# Patient Record
Sex: Male | Born: 1960 | Race: White | Hispanic: No | Smoking: Current every day smoker
Health system: Southern US, Community
[De-identification: ages and names within clinical notes are randomized; demographics above are authoritative.]

## PROBLEM LIST (undated history)

## (undated) DIAGNOSIS — E785 Hyperlipidemia, unspecified: Secondary | ICD-10-CM

## (undated) DIAGNOSIS — S060X9A Concussion with loss of consciousness of unspecified duration, initial encounter: Secondary | ICD-10-CM

## (undated) DIAGNOSIS — J439 Emphysema, unspecified: Secondary | ICD-10-CM

## (undated) DIAGNOSIS — E119 Type 2 diabetes mellitus without complications: Secondary | ICD-10-CM

## (undated) HISTORY — DX: Hyperlipidemia, unspecified: E78.5

## (undated) HISTORY — DX: Emphysema, unspecified: J43.9

## (undated) HISTORY — DX: Type 2 diabetes mellitus without complications: E11.9

## (undated) HISTORY — DX: Concussion with loss of consciousness of unspecified duration, initial encounter: S06.0X9A

---
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
## 2015-04-01 HISTORY — PX: COLONOSCOPY: SHX174

## 2015-05-15 ENCOUNTER — Encounter: Payer: Self-pay | Admitting: Internal Medicine

## 2015-05-15 ENCOUNTER — Ambulatory Visit (INDEPENDENT_AMBULATORY_CARE_PROVIDER_SITE_OTHER): Payer: PRIVATE HEALTH INSURANCE | Admitting: Internal Medicine

## 2015-05-15 VITALS — BP 154/78 | HR 108 | Temp 98.2°F | Resp 15 | Ht 70.0 in | Wt 245.2 lb

## 2015-05-15 DIAGNOSIS — E559 Vitamin D deficiency, unspecified: Secondary | ICD-10-CM | POA: Diagnosis not present

## 2015-05-15 DIAGNOSIS — Z23 Encounter for immunization: Secondary | ICD-10-CM | POA: Diagnosis not present

## 2015-05-15 DIAGNOSIS — R0683 Snoring: Secondary | ICD-10-CM

## 2015-05-15 DIAGNOSIS — E785 Hyperlipidemia, unspecified: Secondary | ICD-10-CM | POA: Diagnosis not present

## 2015-05-15 DIAGNOSIS — R3911 Hesitancy of micturition: Secondary | ICD-10-CM

## 2015-05-15 DIAGNOSIS — Z1211 Encounter for screening for malignant neoplasm of colon: Secondary | ICD-10-CM

## 2015-05-15 DIAGNOSIS — R059 Cough, unspecified: Secondary | ICD-10-CM

## 2015-05-15 DIAGNOSIS — Z720 Tobacco use: Secondary | ICD-10-CM | POA: Insufficient documentation

## 2015-05-15 DIAGNOSIS — M549 Dorsalgia, unspecified: Secondary | ICD-10-CM | POA: Insufficient documentation

## 2015-05-15 DIAGNOSIS — R599 Enlarged lymph nodes, unspecified: Secondary | ICD-10-CM

## 2015-05-15 DIAGNOSIS — R5383 Other fatigue: Secondary | ICD-10-CM | POA: Diagnosis not present

## 2015-05-15 DIAGNOSIS — E669 Obesity, unspecified: Secondary | ICD-10-CM | POA: Insufficient documentation

## 2015-05-15 DIAGNOSIS — M5442 Lumbago with sciatica, left side: Secondary | ICD-10-CM

## 2015-05-15 DIAGNOSIS — Z125 Encounter for screening for malignant neoplasm of prostate: Secondary | ICD-10-CM | POA: Insufficient documentation

## 2015-05-15 DIAGNOSIS — R590 Localized enlarged lymph nodes: Secondary | ICD-10-CM

## 2015-05-15 DIAGNOSIS — B029 Zoster without complications: Secondary | ICD-10-CM

## 2015-05-15 DIAGNOSIS — Z1159 Encounter for screening for other viral diseases: Secondary | ICD-10-CM

## 2015-05-15 DIAGNOSIS — R030 Elevated blood-pressure reading, without diagnosis of hypertension: Secondary | ICD-10-CM

## 2015-05-15 DIAGNOSIS — M545 Low back pain, unspecified: Secondary | ICD-10-CM

## 2015-05-15 DIAGNOSIS — B019 Varicella without complication: Secondary | ICD-10-CM

## 2015-05-15 DIAGNOSIS — D126 Benign neoplasm of colon, unspecified: Secondary | ICD-10-CM | POA: Insufficient documentation

## 2015-05-15 DIAGNOSIS — R339 Retention of urine, unspecified: Secondary | ICD-10-CM

## 2015-05-15 DIAGNOSIS — R05 Cough: Secondary | ICD-10-CM

## 2015-05-15 DIAGNOSIS — E784 Other hyperlipidemia: Secondary | ICD-10-CM

## 2015-05-15 LAB — COMPREHENSIVE METABOLIC PANEL
ALK PHOS: 103 U/L (ref 39–117)
ALT: 36 U/L (ref 0–53)
AST: 30 U/L (ref 0–37)
Albumin: 4.6 g/dL (ref 3.5–5.2)
BUN: 9 mg/dL (ref 6–23)
CHLORIDE: 98 meq/L (ref 96–112)
CO2: 29 meq/L (ref 19–32)
Calcium: 9.7 mg/dL (ref 8.4–10.5)
Creatinine, Ser: 0.69 mg/dL (ref 0.40–1.50)
GFR: 127.54 mL/min (ref >60.00)
GLUCOSE: 214 mg/dL — AB (ref 70–99)
POTASSIUM: 3.8 meq/L (ref 3.5–5.1)
SODIUM: 135 meq/L (ref 135–145)
TOTAL PROTEIN: 8.1 g/dL (ref 6.0–8.3)
Total Bilirubin: 0.8 mg/dL (ref 0.2–1.2)

## 2015-05-15 LAB — VITAMIN D 25 HYDROXY (VIT D DEFICIENCY, FRACTURES): VITD: 9.06 ng/mL — ABNORMAL LOW (ref 30.00–100.00)

## 2015-05-15 LAB — CBC WITH DIFFERENTIAL/PLATELET
BASOS PCT: 0.4 % (ref 0.0–3.0)
Basophils Absolute: 0.1 10*3/uL (ref 0.0–0.1)
EOS ABS: 0.1 10*3/uL (ref 0.0–0.7)
EOS PCT: 0.6 % (ref 0.0–5.0)
HEMATOCRIT: 46.7 % (ref 39.0–52.0)
HEMOGLOBIN: 16 g/dL (ref 13.0–17.0)
LYMPHS PCT: 24.1 % (ref 12.0–46.0)
Lymphs Abs: 3.1 10*3/uL (ref 0.7–4.0)
MCHC: 34.2 g/dL (ref 30.0–36.0)
MCV: 84 fl (ref 78.0–100.0)
MONOS PCT: 5.2 % (ref 3.0–12.0)
Monocytes Absolute: 0.7 10*3/uL (ref 0.1–1.0)
NEUTROS ABS: 8.9 10*3/uL — AB (ref 1.4–7.7)
Neutrophils Relative %: 69.7 % (ref 43.0–77.0)
PLATELETS: 178 10*3/uL (ref 150.0–400.0)
RBC: 5.56 Mil/uL (ref 4.22–5.81)
RDW: 12.9 % (ref 11.5–15.5)
WBC: 12.8 10*3/uL — ABNORMAL HIGH (ref 4.0–10.5)

## 2015-05-15 LAB — LIPID PANEL
CHOL/HDL RATIO: 6
Cholesterol: 171 mg/dL (ref 0–200)
HDL: 26.6 mg/dL — AB (ref >39.00)
LDL CALC: 113 mg/dL — AB (ref 0–99)
NONHDL: 144.56
TRIGLYCERIDES: 157 mg/dL — AB (ref 0.0–149.0)
VLDL: 31.4 mg/dL (ref 0.0–40.0)

## 2015-05-15 LAB — HEMOGLOBIN A1C: Hgb A1c MFr Bld: 10.5 % — ABNORMAL HIGH (ref 4.6–6.5)

## 2015-05-15 LAB — PSA: PSA: 0.53 ng/mL (ref 0.10–4.00)

## 2015-05-15 LAB — TSH: TSH: 1.22 u[IU]/mL (ref 0.35–4.50)

## 2015-05-15 NOTE — Assessment & Plan Note (Signed)
Checking varicella titers since he cannot recall childhood infection with chicken pox

## 2015-05-15 NOTE — Progress Notes (Signed)
Pre visit review using our clinic review tool, if applicable. No additional management support is needed unless otherwise documented below in the visit note. 

## 2015-05-15 NOTE — Patient Instructions (Signed)
I think your rash was a case of shingles,,  It appears to be resolivng  Your blood tests today are going to screen you  For  Prostate and urinary issues Prior immunity to chickenpox Cholesterol Diabetes Thyroid Vitamin deficiencies (b12 and D)   Your blood pressure is up today (normal is 130/80 or less)  Please get it checked a few times AWAY from the doctor's office over the next month to see if is still up  Chest x ray and low back films have been ordered.   You can go any time during 8 to 5 to [facility],  Radiology dept) or to the [facility] office   We will get the colon cancer screening test approved by your insurance and it will be sent to your home

## 2015-05-15 NOTE — Assessment & Plan Note (Signed)
Likely bph.  ruling out infection , checking psa

## 2015-05-15 NOTE — Progress Notes (Signed)
Subjective:  Patient ID: Frank Hicks, male    DOB: Aug 21, 1960  Age: 55 y.o. MRN: [id number]  CC: The primary encounter diagnosis was Vitamin D deficiency. Diagnoses of Obesity, Other fatigue, Dyslipidemia, Urinary hesitancy, Screening for prostate cancer, Back pain at L4-L5 level, Cough, Varicella-zoster infection, Need for hepatitis C screening test, Snoring, Elevated blood pressure reading without diagnosis of hypertension, Dyslipidemia (high LDL; low HDL), Shingles rash, Left cervical lymphadenopathy, Tobacco abuse, Left-sided low back pain with left-sided sciatica, Urinary retention, and Screening for colon cancer were also pertinent to this visit.  HPI Frank Hicks presents for new patient evaluation with several recent new onset symptoms  1) New onset back pain.  Low back pain in the low back on the left that started radiating into the left buttock  And posterior   Thigh. He denies numbness or weakness.  The pain is intermittent and is aggravated by sitting.   making it hard to sleep .  Typically a left side sleeper.   No prior injury to lower back and no recent unusual activity of heavy lifting.   He took several doses of ibuprofen (which he has taken before, but not in a while) and developed a vesicular rash on front of his neck accompanied by cervical lymphadenopathy.  The rash spread to left shoulder and left arm over several days.  He denies pain but has had some pruritus.  He applied rubbing alcohol  Then applied neosporin once or twice.    Cervical lymphadenopathy  occurred in the absence of cold symptoms. .  Snoring: chronic,  With no hypersomnolence  No morning headaches  History of low HDL  high LDL   Smokes a pack daily on the weekend, smokes 1/2 to 3/4 during the week. 24 year pack history ,  Occasional etoh on holidays only, no illicits   FH father was a diabetic and had CAD . With multiple bypasses and coronary stents.   Died suddenly at home at the of of 42.   Mother  died at 32 in her sleep after a flu like illness that did not improve.  History of migraines,  history of CAD,  No tobacco.       History Frank Hicks has a past medical history of Dyslipidemia (high LDL; low HDL).   He has past surgical history that includes tracheotomy (1970).   His family history includes Diabetes in his paternal grandmother.He reports that he has been smoking.  He does not have any smokeless tobacco history on file. He reports that he drinks alcohol. He reports that he does not use illicit drugs.  No outpatient prescriptions prior to visit.   No facility-administered medications prior to visit.    Review of Systems:  Patient denies headache, fevers, malaise, unintentional weight loss, skin rash, eye pain, sinus congestion and sinus pain, sore throat, dysphagia,  hemoptysis , cough, dyspnea, wheezing, chest pain, palpitations, orthopnea, edema, abdominal pain, nausea, melena, diarrhea, constipation, flank pain, dysuria, hematuria, urinary  Frequency, nocturia, numbness, tingling, seizures,  Focal weakness, Loss of consciousness,  Tremor, insomnia, depression, anxiety, and suicidal ideation.     Objective:  BP 154/78 mmHg  Pulse 108  Temp(Src) 98.2 F (36.8 C) (Oral)  Resp 15  Ht 5\' 10"  (1.778 m)  Wt 245 lb 3.2 oz (111.222 kg)  BMI 35.18 kg/m2  SpO2 96%  Physical Exam:  General appearance: alert, cooperative and appears stated age Ears: normal TM's and external ear canals both ears Throat: lips, mucosa, and tongue  normal; teeth and gums normal Neck: thick, with non tender  left cervical  adenopathy, no carotid bruit, supple, symmetrical, trachea midline and thyroid not enlarged, symmetric, no tenderness/mass/nodules Back: symmetric, no curvature. ROM normal. No CVA tenderness. Lungs: clear to auscultation bilaterally Heart: regular rate and rhythm, S1, S2 normal, no murmur, click, rub or gallop Abdomen: soft, non-tender; bowel sounds normal; no masses,  no  organomegaly Pulses: 2+ and symmetric Skin: Skin color, texture, turgor normal. No rashes or lesions Lymph nodes: supraclavicular, and axillary nodes normal.   Assessment & Plan:   Problem List Items Addressed This Visit    Snoring   Elevated blood pressure reading without diagnosis of hypertension    Asked to repeat readings away from office. givne snoring and thick neck,  suspect OSA>       Dyslipidemia (high LDL; low HDL)   Shingles rash    Checking varicella titers since he cannot recall childhood infection with chicken pox      Left cervical lymphadenopathy    Occurred in the setting of a shingles rash.  Will reassess in a few weeks,  If still present biopsy advised. .  Chest x ray ordered. .      Tobacco abuse   Back pain    New onset without histor yof injury.  Given history of tobacco abuse and cervical LAD, will image lower spine to rule out lytic lesion       Obesity   Relevant Orders   Hemoglobin A1c (Completed)   Urinary retention    Likely bph.  ruling out infection , checking psa      Screening for prostate cancer   Relevant Orders   Comprehensive metabolic panel (Completed)   PSA (Completed)   Screening for colon cancer    No prior screening.  cologuard ordered.        Other Visit Diagnoses    Vitamin D deficiency    -  Primary    Other fatigue        Relevant Orders    Comprehensive metabolic panel (Completed)    CBC with Differential/Platelet (Completed)    TSH (Completed)    Dyslipidemia        Relevant Orders    Lipid panel (Completed)    Urinary hesitancy        Relevant Orders    POCT urinalysis dipstick    Back pain at L4-L5 level        Relevant Orders    DG Lumbar Spine Complete    Cough        Relevant Orders    DG Chest 2 View    Varicella-zoster infection        Relevant Orders    Varicella Zoster Abs, IgG/IgM    Need for hepatitis C screening test        Relevant Orders    Hepatitis C antibody      A total of 45  minutes was spent with patient more than half of which was spent in counseling patient on the above mentioned issues ,  and coordination of care.   I am having Frank Hicks maintain his (Flaxseed, Linseed, (FLAXSEED OIL PO)).  Meds ordered this encounter  Medications  . Flaxseed, Linseed, (FLAXSEED OIL PO)    Sig: Take by mouth.    There are no discontinued medications.  Follow-up: No Follow-up on file.   Crecencio Mc, MD

## 2015-05-15 NOTE — Assessment & Plan Note (Signed)
No prior screening.  cologuard ordered.

## 2015-05-15 NOTE — Assessment & Plan Note (Signed)
Asked to repeat readings away from office. givne snoring and thick neck,  suspect OSA>

## 2015-05-15 NOTE — Assessment & Plan Note (Signed)
New onset without histor yof injury.  Given history of tobacco abuse and cervical LAD, will image lower spine to rule out lytic lesion

## 2015-05-15 NOTE — Assessment & Plan Note (Signed)
Occurred in the setting of a shingles rash.  Will reassess in a few weeks,  If still present biopsy advised. .  Chest x ray ordered. Marland Kitchen

## 2015-05-16 DIAGNOSIS — IMO0002 Reserved for concepts with insufficient information to code with codable children: Secondary | ICD-10-CM | POA: Insufficient documentation

## 2015-05-16 DIAGNOSIS — E1165 Type 2 diabetes mellitus with hyperglycemia: Secondary | ICD-10-CM | POA: Insufficient documentation

## 2015-05-16 DIAGNOSIS — E559 Vitamin D deficiency, unspecified: Secondary | ICD-10-CM | POA: Insufficient documentation

## 2015-05-16 LAB — VARICELLA ZOSTER ABS, IGG/IGM
Varicella IgM: 1.15 index — ABNORMAL HIGH (ref 0.00–0.90)
Varicella zoster IgG: >4000 index (ref >165)

## 2015-05-16 LAB — HEPATITIS C ANTIBODY: HCV Ab: NEGATIVE

## 2015-05-16 MED ORDER — ERGOCALCIFEROL 1.25 MG (50000 UT) PO CAPS: 50000.0000 [IU] | ORAL_CAPSULE | ORAL | Status: DC

## 2015-05-16 NOTE — Addendum Note (Signed)
Addended by: Crecencio Mc on: 05/16/2015 11:32 PM   Modules accepted: Orders

## 2015-05-16 NOTE — Progress Notes (Signed)
Cologuard ordered

## 2015-05-23 ENCOUNTER — Ambulatory Visit (INDEPENDENT_AMBULATORY_CARE_PROVIDER_SITE_OTHER): Payer: PRIVATE HEALTH INSURANCE | Admitting: Internal Medicine

## 2015-05-23 ENCOUNTER — Encounter: Payer: Self-pay | Admitting: Internal Medicine

## 2015-05-23 VITALS — BP 150/80 | HR 102 | Temp 98.3°F | Resp 14 | Ht 70.0 in | Wt 238.8 lb

## 2015-05-23 DIAGNOSIS — M5442 Lumbago with sciatica, left side: Secondary | ICD-10-CM

## 2015-05-23 DIAGNOSIS — E559 Vitamin D deficiency, unspecified: Secondary | ICD-10-CM

## 2015-05-23 DIAGNOSIS — E784 Other hyperlipidemia: Secondary | ICD-10-CM

## 2015-05-23 DIAGNOSIS — N182 Chronic kidney disease, stage 2 (mild): Secondary | ICD-10-CM

## 2015-05-23 DIAGNOSIS — E669 Obesity, unspecified: Secondary | ICD-10-CM | POA: Diagnosis not present

## 2015-05-23 DIAGNOSIS — E785 Hyperlipidemia, unspecified: Secondary | ICD-10-CM

## 2015-05-23 DIAGNOSIS — E1122 Type 2 diabetes mellitus with diabetic chronic kidney disease: Secondary | ICD-10-CM

## 2015-05-23 DIAGNOSIS — E119 Type 2 diabetes mellitus without complications: Secondary | ICD-10-CM | POA: Diagnosis not present

## 2015-05-23 DIAGNOSIS — E1165 Type 2 diabetes mellitus with hyperglycemia: Secondary | ICD-10-CM

## 2015-05-23 DIAGNOSIS — IMO0002 Reserved for concepts with insufficient information to code with codable children: Secondary | ICD-10-CM

## 2015-05-23 MED ORDER — GLUCOSE BLOOD VI STRP: ORAL_STRIP | Status: DC

## 2015-05-23 MED ORDER — METFORMIN HCL ER 500 MG PO TB24: 500.0000 mg | ORAL_TABLET | Freq: Every day | ORAL | Status: DC

## 2015-05-23 NOTE — Progress Notes (Signed)
Subjective:  Patient ID: Frank Hicks, male    DOB: 09-04-1960  Age: 55 y.o. MRN: [id number]  CC: The primary encounter diagnosis was Diabetes mellitus without complication ([place]). Diagnoses of Left-sided low back pain with left-sided sciatica, Vitamin D deficiency, Obesity, Uncontrolled type 2 diabetes mellitus with stage 2 chronic kidney disease, without long-term current use of insulin ([place]), and Dyslipidemia (high LDL; low HDL) were also pertinent to this visit.  HPI Frank Hicks presents for follow up on new onset DM, diagnosed at last visit two weeks ago.  and persistent back pain .    His back pain still present  , pain is focused in the  left sciatic notch, and he notes numbness now in the toes on his left foot only.  He has not had the ordered lumbar spine films.    New onset DM:  No prior diagnosis.  No health care in over 5 years.  Diet reviewed.  Does not exercise  Cervical LAD:  Currently suffereing from a viral URI  Shingles:  Neck and left shoulder rash are resolving with no new lesions.    Tobacco use discussed:  Spent 3 minutes discussing risk of continued tobacco abuse, including but not limited to CAD, PAD, hypertension, and CA.  He is not interested in pharmacotherapy at this time.   Outpatient Prescriptions Prior to Visit  Medication Sig Dispense Refill  . ergocalciferol (DRISDOL) 50000 units capsule Take 1 capsule (50,000 Units total) by mouth once a week. 12 capsule 0  . Flaxseed, Linseed, (FLAXSEED OIL PO) Take by mouth.     No facility-administered medications prior to visit.   Lab Results  Component Value Date   HGBA1C 10.5* 05/15/2015    Review of Systems;  Patient denies headache, fevers, malaise, unintentional weight loss, skin rash, eye pain, sinus congestion and sinus pain, sore throat, dysphagia,  hemoptysis , cough, dyspnea, wheezing, chest pain, palpitations, orthopnea, edema, abdominal pain, nausea, melena, diarrhea, constipation, flank pain,  dysuria, hematuria, urinary  Frequency, nocturia, numbness, tingling, seizures,  Focal weakness, Loss of consciousness,  Tremor, insomnia, depression, anxiety, and suicidal ideation.      Objective:  BP 150/80 mmHg  Pulse 102  Temp(Src) 98.3 F (36.8 C) (Oral)  Resp 14  Ht 5\' 10"  (1.778 m)  Wt 238 lb 12.8 oz (108.319 kg)  BMI 34.26 kg/m2  SpO2 96%  BP Readings from Last 3 Encounters:  05/23/15 150/80  05/15/15 154/78    Wt Readings from Last 3 Encounters:  05/23/15 238 lb 12.8 oz (108.319 kg)  05/15/15 245 lb 3.2 oz (111.222 kg)    General appearance: alert, cooperative and appears stated age Ears: normal TM's and external ear canals both ears Throat: lips, mucosa, and tongue normal; teeth and gums normal Neck: no adenopathy, no carotid bruit, supple, symmetrical, trachea midline and thyroid not enlarged, symmetric, no tenderness/mass/nodules Back: symmetric, no curvature. ROM normal. No CVA tenderness. Lungs: clear to auscultation bilaterally Heart: regular rate and rhythm, S1, S2 normal, no murmur, click, rub or gallop Abdomen: soft, non-tender; bowel sounds normal; no masses,  no organomegaly Pulses: 2+ and symmetric Skin: Skin color, texture, turgor normal. No rashes or lesions Lymph nodes: Cervical, supraclavicular, and axillary nodes normal.  Lab Results  Component Value Date   HGBA1C 10.5* 05/15/2015    Lab Results  Component Value Date   CREATININE 0.69 05/15/2015    Lab Results  Component Value Date   WBC 12.8* 05/15/2015   HGB 16.0 05/15/2015  HCT 46.7 05/15/2015   PLT 178.0 05/15/2015   GLUCOSE 214* 05/15/2015   CHOL 171 05/15/2015   TRIG 157.0* 05/15/2015   HDL 26.60* 05/15/2015   LDLCALC 113* 05/15/2015   ALT 36 05/15/2015   AST 30 05/15/2015   NA 135 05/15/2015   K 3.8 05/15/2015   CL 98 05/15/2015   CREATININE 0.69 05/15/2015   BUN 9 05/15/2015   CO2 29 05/15/2015   TSH 1.22 05/15/2015   PSA 0.53 05/15/2015   HGBA1C 10.5* 05/15/2015     MICROALBUR <0.7 05/23/2015    Patient was never admitted.  Assessment & Plan:   Problem List Items Addressed This Visit    Dyslipidemia (high LDL; low HDL)    Based on current lipid profile, the risk of clinically significant Coronary artery disease is 48% over the next 10 years, using the Framingham risk calculator.  Recommended statin therapy to reduce risk of CAD  Patient will have repeat lipids done in 3 months after low GI diet has been followed.    Lab Results  Component Value Date   CHOL 171 05/15/2015   HDL 26.60* 05/15/2015   LDLCALC 113* 05/15/2015   TRIG 157.0* 05/15/2015   CHOLHDL 6 05/15/2015         Back pain    Sciatica presentation.  Plain films ordered /       Obesity    I spent 15 minutes addressing  BMI and recommended wt loss of 10% of body weight over the next 6 months using a low glycemic index diet and regular exercise a minimum of 5 days per week. he has had difficulty losing weight due to decreased participation in regular exercise.  Thyroid function is normal   Lab Results  Component Value Date   TSH 1.22 05/15/2015         Relevant Medications   metFORMIN (GLUCOPHAGE XR) 500 MG 24 hr tablet   DM (diabetes mellitus), type 2, uncontrolled ([place])    He is fasting today and his BS is 119 .  Metformin  Prescribed.  Low GI diet discussed.  Need to have eye exam discussed.  patient given glucometer and asked to check BS once daily at various times  Lab Results  Component Value Date   HGBA1C 10.5* 05/15/2015   Lab Results  Component Value Date   MICROALBUR <0.7 05/23/2015   .       Relevant Medications   metFORMIN (GLUCOPHAGE XR) 500 MG 24 hr tablet   Vitamin D deficiency    To be treated with Drisdol weekly x 3 months        Other Visit Diagnoses    Diabetes mellitus without complication ([place])    -  Primary    Relevant Medications    metFORMIN (GLUCOPHAGE XR) 500 MG 24 hr tablet    Other Relevant Orders    Microalbumin /  creatinine urine ratio (Completed)    Urinalysis, microscopic only (Completed)       I am having Frank Hicks start on metFORMIN and glucose blood. I am also having him maintain his (Flaxseed, Linseed, (FLAXSEED OIL PO)) and ergocalciferol.  Meds ordered this encounter  Medications  . metFORMIN (GLUCOPHAGE XR) 500 MG 24 hr tablet    Sig: Take 1 tablet (500 mg total) by mouth daily with breakfast.    Dispense:  90 tablet    Refill:  1  . glucose blood test strip    Sig: USE THREE TIMES  DAILY TO CHECK BLOOD  SUGARS  FOR A1C 10.5    Dispense:  100 each    Refill:  12   A total of 40 minutes was spent with patient more than half of which was spent in counseling patient on the above mentioned issues , reviewing and explaining recent labs and imaging studies done, and coordination of care.  There are no discontinued medications.  Follow-up: No Follow-up on file.   Crecencio Mc, MD

## 2015-05-23 NOTE — Progress Notes (Signed)
Pre visit review using our clinic review tool, if applicable. No additional management support is needed unless otherwise documented below in the visit note. 

## 2015-05-23 NOTE — Patient Instructions (Addendum)
You have type 2 Diabetes Mellitus .  I am going to start you on metforminXR,  A medication to help gently lower your sugars  Over time. Take it once daily with a meal   Eliminating unnecessary starches and getting regular exercise are very important ways to help the medications lower your blood sugars  Normal "fasting" sugars are  Between 80 to 120.  Normal "post prandial"  sugars (2 hours after you eat) range from  120 to 160  Please check you sugar once daily at various times (see sheet)    This is  my version of a  "Low GI"  Diet:  It will still lower your blood sugars and allow you to lose 4 to 8  lbs  per month if you follow it carefully.  Your goal with exercise is a minimum of 30 minutes of aerobic exercise 5 days per week (Walking does not count once it becomes easy!)     All of the foods can be found at grocery stores and in bulk at [employer].  The Atkins protein bars and shakes are available in more varieties at [employer], [employer] and [place].     7 AM Breakfast:  Choose from the following:  Low carbohydrate Protein  Shakes (I recommend the [employer]AS AdvantEdge "Carb Control" shakes  Or the low carb shakes by Atkins.    2.5 carbs   Arnold's "Sandwhich Thin"toasted  w/ peanut butter (no jelly: about 20 net carbs  "Bagel Thin" with cream cheese and salmon: about 20 carbs   a scrambled egg/bacon/cheese burrito made with Mission's "carb balance" whole wheat tortilla  (about 10 net carbs )  A slice of home made fritatta (egg based dish without a crust:  google it)    Avoid cereal and bananas, oatmeal and cream of wheat and grits. They are loaded with carbohydrates!   10 AM: high protein snack  Protein bar by Atkins (the snack size, under 200 cal, usually < 6 net carbs).    A stick of cheese:  Around 1 carb,  100 cal     Dannon Light n Fit [place] Yogurt  (80 cal, 8 carbs)  Other so called "protein bars" and Greek yogurts tend to be loaded with carbohydrates.  Remember, in food  advertising, the word "energy" is synonymous for " carbohydrate."  Lunch:   A Sandwich using the bread choices listed, Can use any  Eggs,  lunchmeat, grilled meat or canned tuna), avocado, regular mayo/mustard  and cheese.  A Salad using blue cheese, ranch,  Goddess or vinagrette,  No croutons or "confetti" and no "candied nuts" but regular nuts OK.   No pretzels or chips.  Pickles and miniature sweet peppers are a good low carb alternative that provide a "crunch"  The bread is the only source of carbohydrate in a sandwich and  can be decreased by trying some of these alternatives to traditional loaf bread:  Joseph's makes a pita bread and a flat bread (called "Lavash") that are 50 cal and 4 net carbs available at [employer]Js, [employer].  This can be toasted to use with hummous as well  Toufayan makes a low carb flatbread that's 100 cal and 9 net carbs available at [employer] and BJ's makes 2 sizes of  Low carb whole wheat tortilla  (The large one is 210 cal and 6 net carbs)  Flat Out makes flatbreads that are low carb as well  aa folded flatbread  called "Foldit" available at [employer] "Low fat dressings, as well as [place] dressings They are loaded with sugar.  Ranch,  [employer], Goddess,  vinagrettes are all fine   3 PM/ Mid day  Snack:  Consider  1 ounce of  almonds, walnuts, pistachios, pecans, peanuts,  Macadamia nuts or a nut medley.  Avoid "granola"; the dried cranberries and raisins are loaded with carbohydrates. Mixed nuts as long as there are no raisins,  cranberries or dried fruit.    Try the prosciutto/mozzarella cheese sticks by Fiorruci  In deli /backery section   High protein   !   Pork rinds!  Yes Pork Rinds        6 PM  Dinner:     Meat/fowl/fish with a green salad, and either broccoli, cauliflower, green beans, spinach, brussel sprouts or  Lima beans. DO NOT BREAD THE PROTEIN!!      There is a low carb pasta by  Dreamfield's that is acceptable and tastes great: only 5 digestible carbs/serving.( All grocery stores but BJs carry it )  There are frozen dinners that are low carb:  Try Michel Angelo's chicken piccata or chicken or eggplant parm over low carb pasta.(Lowes and BJs)   Marjory Lies Sanchez's "Carnitas" (pulled pork, no sauce,  0 carbs) or his beef pot roast to make a dinner burrito (at BJ's)     Whole wheat pasta is still full of digestible carbs and  Not as low in glycemic index as Dreamfield's.   Brown rice is still rice,  So skip the rice and noodles if you eat [place] or [place] (or at least limit to 1/2 cup)  9 PM snack :   Breyer's "low carb" fudgsicle or  ice cream bar (Carb Smart line), or  Weight Watcher's ice cream bar , or another "no sugar added" ice cream;  a serving of fresh berries/cherries with whipped cream   Cheese or DANNON'S LlGHT N FIT GREEK YOGURT or the [place] greek yogurt   8 ounces of [place] unsweetened almond/cococunut milk  Cheese and crackers (using WASA crackers,  They are low carb) or peanut butter on low carb crackers or pita bread     Avoid bananas, pineapple, grapes  and watermelon on a regular basis because they are high in sugar.  THINK OF THEM AS DESSERT  Remember that snack Substitutions should be less than 10 NET carbs per serving and meals should be < 25 net carbs. Remember that carbohydrates from fiber do not affect blood sugar, so you can  subtract fiber grams to get the "net carbs " of any particular food item.

## 2015-05-24 LAB — URINALYSIS, MICROSCOPIC ONLY
RBC / HPF: NONE SEEN (ref 0–?)
WBC, UA: NONE SEEN (ref 0–?)

## 2015-05-24 LAB — MICROALBUMIN / CREATININE URINE RATIO
Creatinine,U: 23.2 mg/dL
Microalb Creat Ratio: 3 mg/g (ref 0.0–30.0)

## 2015-05-26 NOTE — Assessment & Plan Note (Signed)
I spent 15 minutes addressing  BMI and recommended wt loss of 10% of body weight over the next 6 months using a low glycemic index diet and regular exercise a minimum of 5 days per week. he has had difficulty losing weight due to decreased participation in regular exercise.  Thyroid function is normal   Lab Results  Component Value Date   TSH 1.22 05/15/2015

## 2015-05-26 NOTE — Assessment & Plan Note (Signed)
Sciatica presentation.  Plain films ordered /

## 2015-05-26 NOTE — Assessment & Plan Note (Addendum)
He is fasting today and his BS is 119 .  Metformin  Prescribed.  Low GI diet discussed.  Need to have eye exam discussed.  patient given glucometer and asked to check BS once daily at various times  Lab Results  Component Value Date   HGBA1C 10.5* 05/15/2015   Lab Results  Component Value Date   MICROALBUR <0.7 05/23/2015   .

## 2015-05-26 NOTE — Assessment & Plan Note (Addendum)
Based on current lipid profile, the risk of clinically significant Coronary artery disease is 48% over the next 10 years, using the Framingham risk calculator.  Recommended statin therapy to reduce risk of CAD  Patient will have repeat lipids done in 3 months after low GI diet has been followed.    Lab Results  Component Value Date   CHOL 171 05/15/2015   HDL 26.60* 05/15/2015   LDLCALC 113* 05/15/2015   TRIG 157.0* 05/15/2015   CHOLHDL 6 05/15/2015

## 2015-05-26 NOTE — Assessment & Plan Note (Signed)
To be treated with Drisdol weekly x 3 months

## 2015-05-28 ENCOUNTER — Ambulatory Visit: Payer: PRIVATE HEALTH INSURANCE | Admitting: Internal Medicine

## 2015-06-07 LAB — COLOGUARD: COLOGUARD: POSITIVE

## 2015-06-25 ENCOUNTER — Telehealth: Payer: Self-pay | Admitting: Internal Medicine

## 2015-06-25 DIAGNOSIS — Z1211 Encounter for screening for malignant neoplasm of colon: Secondary | ICD-10-CM

## 2015-06-25 NOTE — Telephone Encounter (Signed)
Your referral is in process as requested. Our referral coordinator will call you when the appointment has been made. With Dr Hilarie Fredrickson

## 2015-06-25 NOTE — Telephone Encounter (Signed)
The results of patient's cologuard is  Positive. This may indicate a polyp somewhere in the colon.  I  would like to refer him to GI for further evaluation .    Is he willing to see one and does he have a preference?

## 2015-06-25 NOTE — Telephone Encounter (Signed)
Notified patient positive results and patient would prefer [facility]

## 2015-06-26 NOTE — Telephone Encounter (Signed)
Patient notified

## 2015-09-19 ENCOUNTER — Encounter: Payer: Self-pay | Admitting: Internal Medicine

## 2015-11-14 ENCOUNTER — Encounter (INDEPENDENT_AMBULATORY_CARE_PROVIDER_SITE_OTHER): Payer: Self-pay

## 2015-11-14 ENCOUNTER — Ambulatory Visit (AMBULATORY_SURGERY_CENTER): Payer: Self-pay | Admitting: *Deleted

## 2015-11-14 VITALS — Ht 70.0 in | Wt 182.0 lb

## 2015-11-14 DIAGNOSIS — Z1211 Encounter for screening for malignant neoplasm of colon: Secondary | ICD-10-CM

## 2015-11-14 MED ORDER — NA SULFATE-K SULFATE-MG SULF 17.5-3.13-1.6 GM/177ML PO SOLN: 1.0000 | Freq: Once | ORAL | 0 refills | Status: AC

## 2015-11-14 NOTE — Progress Notes (Signed)
No egg or soy allergy known to patient  No issues with past sedation with any surgeries  or procedures, no intubation problems  No diet pills per patient No home 02 use per patient  No blood thinners per patient  Pt states  issues with constipation  But very rare- occ issue not daily

## 2015-11-20 ENCOUNTER — Encounter: Payer: Self-pay | Admitting: Internal Medicine

## 2015-11-28 ENCOUNTER — Ambulatory Visit (AMBULATORY_SURGERY_CENTER): Payer: PRIVATE HEALTH INSURANCE | Admitting: Internal Medicine

## 2015-11-28 ENCOUNTER — Encounter: Payer: Self-pay | Admitting: Internal Medicine

## 2015-11-28 VITALS — BP 120/74 | HR 78 | Temp 96.8°F | Resp 14 | Ht 70.0 in | Wt 182.0 lb

## 2015-11-28 DIAGNOSIS — K621 Rectal polyp: Secondary | ICD-10-CM

## 2015-11-28 DIAGNOSIS — D123 Benign neoplasm of transverse colon: Secondary | ICD-10-CM

## 2015-11-28 DIAGNOSIS — Z1211 Encounter for screening for malignant neoplasm of colon: Secondary | ICD-10-CM

## 2015-11-28 DIAGNOSIS — D128 Benign neoplasm of rectum: Secondary | ICD-10-CM

## 2015-11-28 DIAGNOSIS — K635 Polyp of colon: Secondary | ICD-10-CM | POA: Diagnosis not present

## 2015-11-28 DIAGNOSIS — D122 Benign neoplasm of ascending colon: Secondary | ICD-10-CM

## 2015-11-28 DIAGNOSIS — D120 Benign neoplasm of cecum: Secondary | ICD-10-CM

## 2015-11-28 DIAGNOSIS — D125 Benign neoplasm of sigmoid colon: Secondary | ICD-10-CM

## 2015-11-28 MED ORDER — DEXTROSE 5 % IV SOLN: INTRAVENOUS | Status: DC

## 2015-11-28 NOTE — Patient Instructions (Signed)
Findings:  Polyps, Hemorrhoids Recommendations: Resume previous diet, Continue current medications, No aspirin, ibuprofen, naproxen, or other non-steroidal anti-inflammatory drugs for 3 weeks,                                  Repeat colonoscopy depending on pathology.  YOU HAD AN ENDOSCOPIC PROCEDURE TODAY AT [facility]:   Refer to the procedure report that was given to you for any specific questions about what was found during the examination.  If the procedure report does not answer your questions, please call your gastroenterologist to clarify.  If you requested that your care partner not be given the details of your procedure findings, then the procedure report has been included in a sealed envelope for you to review at your convenience later.  YOU SHOULD EXPECT: Some feelings of bloating in the abdomen. Passage of more gas than usual.  Walking can help get rid of the air that was put into your GI tract during the procedure and reduce the bloating. If you had a lower endoscopy (such as a colonoscopy or flexible sigmoidoscopy) you may notice spotting of blood in your stool or on the toilet paper. If you underwent a bowel prep for your procedure, you may not have a normal bowel movement for a few days.  Please Note:  You might notice some irritation and congestion in your nose or some drainage.  This is from the oxygen used during your procedure.  There is no need for concern and it should clear up in a day or so.  SYMPTOMS TO REPORT IMMEDIATELY:   Following lower endoscopy (colonoscopy or flexible sigmoidoscopy):  Excessive amounts of blood in the stool  Significant tenderness or worsening of abdominal pains  Swelling of the abdomen that is new, acute  Fever of 100F or higher   Following upper endoscopy (EGD)  Vomiting of blood or coffee ground material  New chest pain or pain under the shoulder blades  Painful or persistently difficult swallowing  New shortness of  breath  Fever of 100F or higher  Black, tarry-looking stools  For urgent or emergent issues, a gastroenterologist can be reached at any hour by calling [phone].   DIET:  We do recommend a small meal at first, but then you may proceed to your regular diet.  Drink plenty of fluids but you should avoid alcoholic beverages for 24 hours.  ACTIVITY:  You should plan to take it easy for the rest of today and you should NOT DRIVE or use heavy machinery until tomorrow (because of the sedation medicines used during the test).    FOLLOW UP: Our staff will call the number listed on your records the next business day following your procedure to check on you and address any questions or concerns that you may have regarding the information given to you following your procedure. If we do not reach you, we will leave a message.  However, if you are feeling well and you are not experiencing any problems, there is no need to return our call.  We will assume that you have returned to your regular daily activities without incident.  If any biopsies were taken you will be contacted by phone or by letter within the next 1-3 weeks.  Please call us at [phone] if you have not heard about the biopsies in 3 weeks.    SIGNATURES/CONFIDENTIALITY: You and/or your care partner have  signed paperwork which will be entered into your electronic medical record.  These signatures attest to the fact that that the information above on your After Visit Summary has been reviewed and is understood.  Full responsibility of the confidentiality of this discharge information lies with you and/or your care-partner.

## 2015-11-28 NOTE — Progress Notes (Signed)
Called to room to assist during endoscopic procedure.  Patient ID and intended procedure confirmed with present staff. Received instructions for my participation in the procedure from the performing physician.  

## 2015-11-28 NOTE — Progress Notes (Signed)
Pt. Blood sugar 121.  IV switched to NS.

## 2015-11-28 NOTE — Progress Notes (Signed)
No egg or soy allergy known to patient  No issues with past sedation with any surgeries  or procedures, no intubation problems  No diet pills per patient No home 02 use per patient  No blood thinners per patient  Pt denies issues with constipation  No A fib or A flutter  Informed Dr Hilarie Fredrickson of Blood sugar 75 and that we started D 5- verbal okay per JMP- Lenard Galloway RN

## 2015-11-28 NOTE — Op Note (Signed)
[place] Patient Name: Frank Hicks Procedure Date: 11/28/2015 9:10 AM MRN: [id number] Endoscopist: Jerene Bears , MD Age: 55 Referring MD:  Date of Birth: 1961/03/10 Gender: Male Account #: [id number] Procedure:                Colonoscopy Indications:              Screening for colorectal malignant neoplasm, This                            is the patient's first colonoscopy Medicines:                Monitored Anesthesia Care Procedure:                Pre-Anesthesia Assessment:                           - Prior to the procedure, a History and Physical                            was performed, and patient medications and                            allergies were reviewed. The patient's tolerance of                            previous anesthesia was also reviewed. The risks                            and benefits of the procedure and the sedation                            options and risks were discussed with the patient.                            All questions were answered, and informed consent                            was obtained. Prior Anticoagulants: The patient has                            taken no previous anticoagulant or antiplatelet                            agents. ASA Grade Assessment: II - A patient with                            mild systemic disease. After reviewing the risks                            and benefits, the patient was deemed in                            satisfactory condition to undergo the procedure.  After obtaining informed consent, the colonoscope                            was passed under direct vision. Throughout the                            procedure, the patient's blood pressure, pulse, and                            oxygen saturations were monitored continuously. The                            Model PCF-H190DL [id number]) scope was introduced                            through the anus and advanced  to the the cecum,                            identified by appendiceal orifice and ileocecal                            valve. The colonoscopy was performed without                            difficulty. The patient tolerated the procedure                            well. The quality of the bowel preparation was                            good. The ileocecal valve, appendiceal orifice, and                            rectum were photographed. Scope In: 9:21:45 AM Scope Out: 9:55:24 AM Total Procedure Duration: 0 hours 33 minutes 39 seconds  Findings:                 The perianal exam findings include internal                            hemorrhoids that prolapse with straining, but                            require manual replacement into the anal canal                            (Grade III).                           Two sessile polyps were found in the cecum. The                            polyps were 4 to 5 mm in size. These polyps were  removed with a cold snare. Resection and retrieval                            were complete.                           Three sessile polyps were found in the ascending                            colon. The polyps were 4 to 6 mm in size. These                            polyps were removed with a cold snare. Resection                            and retrieval were complete.                           Two sessile polyps were found in the proximal                            transverse colon. The polyps were 7 to 9 mm in                            size. These polyps were removed with a hot snare.                            Resection and retrieval were complete.                           A 25 mm polyp was found in the proximal transverse                            colon. The polyp was pedunculated. The polyp was                            removed with a hot snare. Resection and retrieval                            with Jabier Mutton net were  complete.                           A 10 mm polyp was found in the distal transverse                            colon. The polyp was sessile. The polyp was removed                            with a hot snare. Resection and retrieval were                            complete.  A 5 mm polyp was found in the distal transverse                            colon. The polyp was sessile. The polyp was removed                            with a cold snare. Resection and retrieval were                            complete.                           Two sessile polyps were found in the sigmoid colon.                            The polyps were 3 to 5 mm in size. These polyps                            were removed with a cold snare. Resection and                            retrieval were complete.                           A 6 mm polyp was found in the rectum. The polyp was                            sessile. The polyp was removed with a cold snare.                            Resection and retrieval were complete.                           Internal hemorrhoids were found during retroflexion                            and during perianal exam. The hemorrhoids were                            large. Complications:            No immediate complications. Estimated Blood Loss:     Estimated blood loss was minimal. Impression:               - Two 4 to 5 mm polyps in the cecum, removed with a                            cold snare. Resected and retrieved.                           - Three 4 to 6 mm polyps in the ascending colon,                            removed with  a cold snare. Resected and retrieved.                           - Two 7 to 9 mm polyps in the proximal transverse                            colon, removed with a hot snare. Resected and                            retrieved.                           - One 25 mm polyp in the proximal transverse colon,                             removed with a hot snare. Resected and retrieved.                           - One 10 mm polyp in the distal transverse colon,                            removed with a hot snare. Resected and retrieved.                           - One 5 mm polyp in the distal transverse colon,                            removed with a cold snare. Resected and retrieved.                           - Two 3 to 5 mm polyps in the sigmoid colon,                            removed with a cold snare. Resected and retrieved.                           - One 6 mm polyp in the rectum, removed with a cold                            snare. Resected and retrieved.                           - Internal hemorrhoids. Recommendation:           - Patient has a contact number available for                            emergencies. The signs and symptoms of potential                            delayed complications were discussed with the  patient. Return to normal activities tomorrow.                            Written discharge instructions were provided to the                            patient.                           - Resume previous diet.                           - Continue present medications.                           - No aspirin, ibuprofen, naproxen, or other                            non-steroidal anti-inflammatory drugs for 3 weeks                            after polyp removal.                           - Await pathology results.                           - Repeat colonoscopy is recommended for                            surveillance of multiple adenomas. The colonoscopy                            date will be determined after pathology results                            from today's exam become available for review. Jerene Bears, MD 11/28/2015 10:03:29 AM This report has been signed electronically.

## 2015-11-28 NOTE — Progress Notes (Signed)
A/ox3 pleased with MAC, report to Tracy RN 

## 2015-11-29 ENCOUNTER — Telehealth: Payer: Self-pay

## 2015-11-29 NOTE — Telephone Encounter (Signed)
  Follow up Call-  Call back number 11/28/2015  Post procedure Call Back phone  # [phone]  Permission to leave phone message Yes  Some recent data might be hidden     Patient questions:  Do you have a fever, pain , or abdominal swelling? No. Pain Score  0 *  Have you tolerated food without any problems? Yes.    Have you been able to return to your normal activities? Yes.    Do you have any questions about your discharge instructions: Diet   No. Medications  No. Follow up visit  No.  Do you have questions or concerns about your Care? No.  Actions: * If pain score is 4 or above: No action needed, pain <4.

## 2015-12-06 ENCOUNTER — Encounter: Payer: Self-pay | Admitting: Internal Medicine

## 2015-12-11 ENCOUNTER — Other Ambulatory Visit: Payer: Self-pay | Admitting: Internal Medicine

## 2016-01-25 ENCOUNTER — Other Ambulatory Visit: Payer: Self-pay | Admitting: Internal Medicine

## 2016-05-14 ENCOUNTER — Telehealth: Payer: Self-pay | Admitting: Internal Medicine

## 2016-08-04 ENCOUNTER — Other Ambulatory Visit: Payer: Self-pay | Admitting: Internal Medicine

## 2016-09-24 ENCOUNTER — Encounter: Payer: Self-pay | Admitting: Internal Medicine

## 2016-11-02 ENCOUNTER — Other Ambulatory Visit: Payer: Self-pay | Admitting: Internal Medicine

## 2016-12-08 ENCOUNTER — Other Ambulatory Visit: Payer: Self-pay | Admitting: Internal Medicine

## 2016-12-24 NOTE — Telephone Encounter (Signed)
Error

## 2017-01-29 DIAGNOSIS — S060X9A Concussion with loss of consciousness of unspecified duration, initial encounter: Secondary | ICD-10-CM

## 2017-01-29 DIAGNOSIS — S060XAA Concussion with loss of consciousness status unknown, initial encounter: Secondary | ICD-10-CM

## 2017-01-29 HISTORY — DX: Concussion with loss of consciousness of unspecified duration, initial encounter: S06.0X9A

## 2017-01-29 HISTORY — DX: Concussion with loss of consciousness status unknown, initial encounter: S06.0XAA

## 2017-02-10 ENCOUNTER — Other Ambulatory Visit: Payer: Self-pay | Admitting: Internal Medicine

## 2017-02-11 ENCOUNTER — Encounter (HOSPITAL_COMMUNITY): Payer: Self-pay | Admitting: Emergency Medicine

## 2017-02-11 ENCOUNTER — Emergency Department (HOSPITAL_COMMUNITY): Payer: Worker's Compensation

## 2017-02-11 ENCOUNTER — Emergency Department (HOSPITAL_COMMUNITY)
Admission: EM | Admit: 2017-02-11 | Discharge: 2017-02-11 | Disposition: A | Discharge status: Home / Self Care | Payer: Worker's Compensation | Attending: Emergency Medicine | Admitting: Emergency Medicine

## 2017-02-11 DIAGNOSIS — Z23 Encounter for immunization: Secondary | ICD-10-CM | POA: Insufficient documentation

## 2017-02-11 DIAGNOSIS — S0101XA Laceration without foreign body of scalp, initial encounter: Secondary | ICD-10-CM

## 2017-02-11 DIAGNOSIS — Y92481 Parking lot as the place of occurrence of the external cause: Secondary | ICD-10-CM | POA: Diagnosis not present

## 2017-02-11 DIAGNOSIS — F17210 Nicotine dependence, cigarettes, uncomplicated: Secondary | ICD-10-CM | POA: Diagnosis not present

## 2017-02-11 DIAGNOSIS — E119 Type 2 diabetes mellitus without complications: Secondary | ICD-10-CM | POA: Diagnosis not present

## 2017-02-11 DIAGNOSIS — Z7984 Long term (current) use of oral hypoglycemic drugs: Secondary | ICD-10-CM | POA: Insufficient documentation

## 2017-02-11 DIAGNOSIS — Y998 Other external cause status: Secondary | ICD-10-CM | POA: Insufficient documentation

## 2017-02-11 DIAGNOSIS — E785 Hyperlipidemia, unspecified: Secondary | ICD-10-CM | POA: Insufficient documentation

## 2017-02-11 DIAGNOSIS — Y939 Activity, unspecified: Secondary | ICD-10-CM | POA: Diagnosis not present

## 2017-02-11 DIAGNOSIS — S52592A Other fractures of lower end of left radius, initial encounter for closed fracture: Secondary | ICD-10-CM | POA: Diagnosis not present

## 2017-02-11 DIAGNOSIS — S52502A Unspecified fracture of the lower end of left radius, initial encounter for closed fracture: Secondary | ICD-10-CM

## 2017-02-11 MED ORDER — LIDOCAINE-EPINEPHRINE (PF) 2 %-1:200000 IJ SOLN
10.0000 mL | Freq: Once | INTRAMUSCULAR | Status: AC
  Administered 2017-02-11: 10 mL
  Filled 2017-02-11: qty 20

## 2017-02-11 MED ORDER — TETANUS-DIPHTH-ACELL PERTUSSIS 5-2.5-18.5 LF-MCG/0.5 IM SUSP
0.5000 mL | Freq: Once | INTRAMUSCULAR | Status: AC
  Administered 2017-02-11: 0.5 mL via INTRAMUSCULAR
  Filled 2017-02-11: qty 0.5
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
## 2017-02-11 NOTE — Discharge Instructions (Signed)
Please read instructions below.  Wound Care Instructions Keep your wound clean and covered. You can let soap and water run over it in the shower to clean it starting tomorrow night. You can apply antibiotic ointment, such as neosporin or bacitracin to your wound daily.  Follow up with your primary care or urgent care for wound recheck and staple removal in 10 days.  You may have a mild concussion. Avoid contact sports. Limit your screen time and complex thinking. Drink plenty of water. Return to the ER for fever, pus draining from wound, redness, or new or worsening symptoms.  Fracture Care Instructions Apply ice to your wrist for 20 minutes at a time. You can take advil/ibuprofen every 6 hours as needed for pain. Keep the splint on at all times until you have been seen by the orthopedic specialist. Schedule an appointment with the orthopedic specialist in 1 week for repeat x-ray and follow-up on your injury.  Return to the ER for severely worsening headache, vision changes, vomiting, if your hand becomes cold and numb, or for new or concerning symptoms.

## 2017-02-11 NOTE — ED Notes (Signed)
Patient transported to CT 
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
## 2017-02-11 NOTE — Progress Notes (Signed)
Orthopedic Tech Progress Note Patient Details:  Frank Hicks 1961-01-25 [id number]  Ortho Devices Type of Ortho Device: Ace wrap, Arm sling, Post (long arm) splint Ortho Device/Splint Location: LUE Ortho Device/Splint Interventions: Ordered, Application   Braulio Bosch 02/11/2017, 9:41 PM

## 2017-02-13 ENCOUNTER — Telehealth: Payer: Self-pay | Admitting: Internal Medicine

## 2017-02-13 ENCOUNTER — Encounter: Payer: Self-pay | Admitting: Internal Medicine

## 2017-02-13 NOTE — Telephone Encounter (Signed)
Was he given an appointment with ortho or hand specialist? ER notes say he was but there is no evidence in chart of a referral made.

## 2017-02-13 NOTE — Telephone Encounter (Signed)
Has an appointment with Dr. Caralyn Guile ortho and surgical per patient.
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
## 2017-02-13 NOTE — Progress Notes (Signed)
error 

## 2017-02-16 ENCOUNTER — Telehealth: Payer: Self-pay
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
## 2017-02-18 ENCOUNTER — Ambulatory Visit (INDEPENDENT_AMBULATORY_CARE_PROVIDER_SITE_OTHER): Payer: PRIVATE HEALTH INSURANCE | Admitting: Internal Medicine

## 2017-02-18 ENCOUNTER — Encounter: Payer: Self-pay | Admitting: Internal Medicine

## 2017-02-18 VITALS — BP 128/72 | HR 91 | Temp 98.0°F | Resp 15 | Ht 70.0 in | Wt 183.4 lb

## 2017-02-18 DIAGNOSIS — I6523 Occlusion and stenosis of bilateral carotid arteries: Secondary | ICD-10-CM

## 2017-02-18 DIAGNOSIS — Z720 Tobacco use: Secondary | ICD-10-CM

## 2017-02-18 DIAGNOSIS — E538 Deficiency of other specified B group vitamins: Secondary | ICD-10-CM | POA: Diagnosis not present

## 2017-02-18 DIAGNOSIS — Z23 Encounter for immunization: Secondary | ICD-10-CM

## 2017-02-18 DIAGNOSIS — Z125 Encounter for screening for malignant neoplasm of prostate: Secondary | ICD-10-CM

## 2017-02-18 DIAGNOSIS — E1165 Type 2 diabetes mellitus with hyperglycemia: Secondary | ICD-10-CM

## 2017-02-18 DIAGNOSIS — J439 Emphysema, unspecified: Secondary | ICD-10-CM

## 2017-02-18 DIAGNOSIS — Z8639 Personal history of other endocrine, nutritional and metabolic disease: Secondary | ICD-10-CM

## 2017-02-18 DIAGNOSIS — D369 Benign neoplasm, unspecified site: Secondary | ICD-10-CM | POA: Diagnosis not present

## 2017-02-18 DIAGNOSIS — D126 Benign neoplasm of colon, unspecified: Secondary | ICD-10-CM

## 2017-02-18 DIAGNOSIS — E559 Vitamin D deficiency, unspecified: Secondary | ICD-10-CM | POA: Diagnosis not present

## 2017-02-18 DIAGNOSIS — S060X0D Concussion without loss of consciousness, subsequent encounter: Secondary | ICD-10-CM

## 2017-02-18 DIAGNOSIS — S52502D Unspecified fracture of the lower end of left radius, subsequent encounter for closed fracture with routine healing: Secondary | ICD-10-CM

## 2017-02-18 DIAGNOSIS — E785 Hyperlipidemia, unspecified: Secondary | ICD-10-CM

## 2017-02-18 LAB — POCT GLYCOSYLATED HEMOGLOBIN (HGB A1C): Hemoglobin A1C: 5.3

## 2017-02-18 LAB — PSA: PSA: 0.66 ng/mL (ref 0.10–4.00)

## 2017-02-18 LAB — LIPID PANEL
Cholesterol: 147 mg/dL (ref 0–200)
HDL: 41.3 mg/dL (ref >39.00)
LDL CALC: 91 mg/dL (ref 0–99)
NONHDL: 105.47
Total CHOL/HDL Ratio: 4
Triglycerides: 73 mg/dL (ref 0.0–149.0)
VLDL: 14.6 mg/dL (ref 0.0–40.0)

## 2017-02-18 LAB — MICROALBUMIN / CREATININE URINE RATIO
Creatinine,U: 47.7 mg/dL
Microalb Creat Ratio: 2.9 mg/g (ref 0.0–30.0)
Microalb, Ur: 1.4 mg/dL (ref 0.0–1.9)

## 2017-02-18 LAB — COMPREHENSIVE METABOLIC PANEL
ALK PHOS: 65 U/L (ref 39–117)
ALT: 21 U/L (ref 0–53)
AST: 28 U/L (ref 0–37)
Albumin: 4.5 g/dL (ref 3.5–5.2)
BUN: 10 mg/dL (ref 6–23)
CO2: 32 mEq/L (ref 19–32)
Calcium: 9.7 mg/dL (ref 8.4–10.5)
Chloride: 100 mEq/L (ref 96–112)
Creatinine, Ser: 0.51 mg/dL (ref 0.40–1.50)
GFR: 179.58 mL/min (ref >60.00)
GLUCOSE: 103 mg/dL — AB (ref 70–99)
POTASSIUM: 4.3 meq/L (ref 3.5–5.1)
SODIUM: 138 meq/L (ref 135–145)
TOTAL PROTEIN: 7.3 g/dL (ref 6.0–8.3)
Total Bilirubin: 0.6 mg/dL (ref 0.2–1.2)

## 2017-02-18 LAB — VITAMIN B12: Vitamin B-12: 445 pg/mL (ref 211–911)

## 2017-02-18 LAB — VITAMIN D 25 HYDROXY (VIT D DEFICIENCY, FRACTURES): VITD: 23.42 ng/mL — AB (ref 30.00–100.00)
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
## 2017-02-18 NOTE — Patient Instructions (Signed)
Peripheral Vascular Disease Peripheral vascular disease (PVD) is a disease of the blood vessels that are not part of your heart and brain. A simple term for PVD is poor circulation. In most cases, PVD narrows the blood vessels that carry blood from your heart to the rest of your body. This can result in a decreased supply of blood to your arms, legs, and internal organs, like your stomach or kidneys. However, it most often affects a person's lower legs and feet. There are two types of PVD.  Organic PVD. This is the more common type. It is caused by damage to the structure of blood vessels.  Functional PVD. This is caused by conditions that make blood vessels contract and tighten (spasm).  Without treatment, PVD tends to get worse over time. PVD can also lead to acute ischemic limb. This is when an arm or limb suddenly has trouble getting enough blood. This is a medical emergency. What are the causes? Each type of PVD has many different causes. The most common cause of PVD is buildup of a fatty material (plaque) inside of your arteries (atherosclerosis). Small amounts of plaque can break off from the walls of the blood vessels and become lodged in a smaller artery. This blocks blood flow and can cause acute ischemic limb. Other common causes of PVD include:  Blood clots that form inside of blood vessels.  Injuries to blood vessels.  Diseases that cause inflammation of blood vessels or cause blood vessel spasms.  Health behaviors and health history that increase your risk of developing PVD.  What increases the risk? You may have a greater risk of PVD if you:  Have a family history of PVD.  Have certain medical conditions, including: ? High cholesterol. ? Diabetes. ? High blood pressure (hypertension). ? Coronary heart disease. ? Past problems with blood clots. ? Past injury, such as burns or a broken bone. These may have damaged blood vessels in your limbs. ? Buerger disease. This is  caused by inflamed blood vessels in your hands and feet. ? Some forms of arthritis. ? Rare birth defects that affect the arteries in your legs.  Use tobacco.  Do not get enough exercise.  Are obese.  Are age 50 or older.  What are the signs or symptoms? PVD may cause many different symptoms. Your symptoms depend on what part of your body is not getting enough blood. Some common signs and symptoms include:  Cramps in your lower legs. This may be a symptom of poor leg circulation (claudication).  Pain and weakness in your legs while you are physically active that goes away when you rest (intermittent claudication).  Leg pain when at rest.  Leg numbness, tingling, or weakness.  Coldness in a leg or foot, especially when compared with the other leg.  Skin or hair changes. These can include: ? Hair loss. ? Shiny skin. ? Pale or bluish skin. ? Thick toenails.  Inability to get or maintain an erection (erectile dysfunction).  People with PVD are more prone to developing ulcers and sores on their toes, feet, or legs. These may take longer than normal to heal. How is this diagnosed? Your health care provider may diagnose PVD from your signs and symptoms. The health care provider will also do a physical exam. You may have tests to find out what is causing your PVD and determine its severity. Tests may include:  Blood pressure recordings from your arms and legs and measurements of the strength of your pulses (  pulse volume recordings).  Imaging studies using sound waves to take pictures of the blood flow through your blood vessels (Doppler ultrasound).  Injecting a dye into your blood vessels before having imaging studies using: ? X-rays (angiogram or arteriogram). ? Computer-generated X-rays (CT angiogram). ? A powerful electromagnetic field and a computer (magnetic resonance angiogram or MRA).  How is this treated? Treatment for PVD depends on the cause of your condition and the  severity of your symptoms. It also depends on your age. Underlying causes need to be treated and controlled. These include long-lasting (chronic) conditions, such as diabetes, high cholesterol, and high blood pressure. You may need to first try making lifestyle changes and taking medicines. Surgery may be needed if these do not work. Lifestyle changes may include:  Quitting smoking.  Exercising regularly.  Following a low-fat, low-cholesterol diet.  Medicines may include:  Blood thinners to prevent blood clots.  Medicines to improve blood flow.  Medicines to improve your blood cholesterol levels.  Surgical procedures may include:  A procedure that uses an inflated balloon to open a blocked artery and improve blood flow (angioplasty).  A procedure to put in a tube (stent) to keep a blocked artery open (stent implant).  Surgery to reroute blood flow around a blocked artery (peripheral bypass surgery).  Surgery to remove dead tissue from an infected wound on the affected limb.  Amputation. This is surgical removal of the affected limb. This may be necessary in cases of acute ischemic limb that are not improved through medical or surgical treatments.  Follow these instructions at home:  Take medicines only as directed by your health care provider.  Do not use any tobacco products, including cigarettes, chewing tobacco, or electronic cigarettes. If you need help quitting, ask your health care provider.  Lose weight if you are overweight, and maintain a healthy weight as directed by your health care provider.  Eat a diet that is low in fat and cholesterol. If you need help, ask your health care provider.  Exercise regularly. Ask your health care provider to suggest some good activities for you.  Use compression stockings or other mechanical devices as directed by your health care provider.  Take good care of your feet. ? Wear comfortable shoes that fit well. ? Check your feet  often for any cuts or sores. Contact a health care provider if:  You have cramps in your legs while walking.  You have leg pain when you are at rest.  You have coldness in a leg or foot.  Your skin changes.  You have erectile dysfunction.  You have cuts or sores on your feet that are not healing. Get help right away if:  Your arm or leg turns cold and blue.  Your arms or legs become red, warm, swollen, painful, or numb.  You have chest pain or trouble breathing.  You suddenly have weakness in your face, arm, or leg.  You become very confused or lose the ability to speak.  You suddenly have a very bad headache or lose your vision. This information is not intended to replace advice given to you by your health care provider. Make sure you discuss any questions you have with your health care provider. Document Released: 04/24/2004 Document Revised: 08/23/2015 Document Reviewed: 08/25/2013 Elsevier Interactive Patient Education  2017 [employer]  

## 2017-02-19 LAB — FOLATE RBC: RBC FOLATE: 430 ng/mL (ref >280)

## 2017-02-20 DIAGNOSIS — S060X0A Concussion without loss of consciousness, initial encounter: Secondary | ICD-10-CM | POA: Insufficient documentation

## 2017-02-20 DIAGNOSIS — J439 Emphysema, unspecified: Secondary | ICD-10-CM | POA: Insufficient documentation

## 2017-02-20 DIAGNOSIS — Z8639 Personal history of other endocrine, nutritional and metabolic disease: Secondary | ICD-10-CM | POA: Insufficient documentation

## 2017-02-20 DIAGNOSIS — S52509A Unspecified fracture of the lower end of unspecified radius, initial encounter for closed fracture: Secondary | ICD-10-CM | POA: Insufficient documentation

## 2017-02-20 MED ORDER — ATORVASTATIN CALCIUM 20 MG PO TABS: 20.0000 mg | ORAL_TABLET | Freq: Every day | ORAL | 0 refills | Status: DC

## 2017-02-20 MED ORDER — ERGOCALCIFEROL 1.25 MG (50000 UT) PO CAPS: 50000.0000 [IU] | ORAL_CAPSULE | ORAL | 3 refills | Status: DC

## 2017-02-20 NOTE — Assessment & Plan Note (Signed)
Spent 3 minutes discussing risk of continued tobacco abuse, including but not limited to CAD, PAD, hypertension, and CA.  He is not interested in pharmacotherapy at this time but is motivated to quit given the findings of  Carotid artery and cerebral artery atherosclerosis noted on his recent CT's/

## 2017-02-20 NOTE — Assessment & Plan Note (Signed)
A1c is now 5.3 after weight loss of 62 lbs done without the aide of appetite suppressants .  Stopping metformin  Repeat a1c in 3 months.  Lab Results  Component Value Date   HGBA1C 5.3 02/18/2017   Lab Results  Component Value Date   MICROALBUR 1.4 02/18/2017

## 2017-02-20 NOTE — Assessment & Plan Note (Signed)
Noted on cervical spine CT.  Smoking cessation advised.

## 2017-02-20 NOTE — Assessment & Plan Note (Addendum)
Multiple tubular adenomas, multiple found on diagnostic colonoscopy after positive cologuard.  repeat colonoscopy is overdue for one year follow up

## 2017-02-20 NOTE — Assessment & Plan Note (Addendum)
Although he has dropped his LDL by 30 points and raised his HDL by nearly 20 pt,  His 10 yr risk is still elevated to 16% because of his tobacco use.  He has evidence of PAD  on CT scans.  Smoking cessation,  Baby asppirin and statin advised.   Lab Results  Component Value Date   CHOL 147 02/18/2017   HDL 41.30 02/18/2017   LDLCALC 91 02/18/2017   TRIG 73.0 02/18/2017   CHOLHDL 4 02/18/2017

## 2017-02-20 NOTE — Assessment & Plan Note (Signed)
Symptoms have resolved. Head CT and spine CT were done at time of injury and reviewed with patient today

## 2017-02-20 NOTE — Assessment & Plan Note (Signed)
Now casted by Ortho.  Checking Vitamin D

## 2017-02-23 ENCOUNTER — Encounter: Payer: Self-pay | Admitting: Internal Medicine

## 2017-02-23 ENCOUNTER — Ambulatory Visit: Payer: PRIVATE HEALTH INSURANCE | Admitting: Internal Medicine

## 2017-02-23 ENCOUNTER — Telehealth: Payer: Self-pay

## 2017-02-23 DIAGNOSIS — S0101XD Laceration without foreign body of scalp, subsequent encounter: Secondary | ICD-10-CM

## 2017-02-23 NOTE — Telephone Encounter (Signed)
LMTCB

## 2017-02-23 NOTE — Telephone Encounter (Signed)
Copied from [place] [phone]. Topic: Quick Communication - See Telephone Encounter >> Feb 23, 2017  1:32 PM Adair Laundry, CMA wrote: CRM for notification. See Telephone encounter for:   02/23/17.  Please transfer Leann to our office.  >> Feb 23, 2017  2:31 PM Percell Belt A wrote: Please call her back at [phone].  She called back but per office in a room

## 2017-02-23 NOTE — Assessment & Plan Note (Signed)
7 staples placed by ED physician were removed today without complication
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
## 2017-03-20 ENCOUNTER — Encounter: Payer: Self-pay | Admitting: Internal Medicine

## 2017-03-26 NOTE — Telephone Encounter (Signed)
We do not do workers comp in the office.

## 2017-03-26 NOTE — Telephone Encounter (Signed)
Worker's comp is not done here in the office.

## 2017-05-01 ENCOUNTER — Other Ambulatory Visit: Payer: Self-pay

## 2017-05-01 ENCOUNTER — Ambulatory Visit (AMBULATORY_SURGERY_CENTER): Payer: Self-pay | Admitting: *Deleted

## 2017-05-01 VITALS — Ht 70.0 in | Wt 185.0 lb

## 2017-05-01 DIAGNOSIS — Z86010 Personal history of colonic polyps: Secondary | ICD-10-CM

## 2017-05-01 MED ORDER — NA SULFATE-K SULFATE-MG SULF 17.5-3.13-1.6 GM/177ML PO SOLN: ORAL | 0 refills | Status: DC

## 2017-05-01 NOTE — Progress Notes (Signed)
Patient denies any allergies to eggs or soy. Patient denies any problems with anesthesia/sedation. Patient denies any oxygen use at home. Patient denies taking any diet/weight loss medications or blood thinners. EMMI education declined by pt.  

## 2017-05-15 ENCOUNTER — Encounter: Payer: Self-pay | Admitting: Internal Medicine

## 2017-05-15 ENCOUNTER — Ambulatory Visit (AMBULATORY_SURGERY_CENTER): Payer: PRIVATE HEALTH INSURANCE | Admitting: Internal Medicine

## 2017-05-15 ENCOUNTER — Other Ambulatory Visit: Payer: Self-pay

## 2017-05-15 VITALS — BP 135/79 | HR 82 | Temp 98.6°F | Resp 17 | Ht 70.0 in | Wt 185.0 lb

## 2017-05-15 DIAGNOSIS — D124 Benign neoplasm of descending colon: Secondary | ICD-10-CM | POA: Diagnosis not present

## 2017-05-15 DIAGNOSIS — D122 Benign neoplasm of ascending colon: Secondary | ICD-10-CM | POA: Diagnosis not present

## 2017-05-15 DIAGNOSIS — Z86010 Personal history of colonic polyps: Secondary | ICD-10-CM | POA: Diagnosis present

## 2017-05-15 DIAGNOSIS — D120 Benign neoplasm of cecum: Secondary | ICD-10-CM

## 2017-05-15 MED ORDER — SODIUM CHLORIDE 0.9 % IV SOLN: 500.0000 mL | Freq: Once | INTRAVENOUS | Status: AC

## 2017-05-15 NOTE — Op Note (Signed)
[facility] Patient Name: Frank Hicks Procedure Date: 05/15/2017 9:28 AM MRN: [id number] Endoscopist: Jerene Bears , MD Age: 57 Referring MD:  Date of Birth: [birth year] Gender: Male Account #: [id number] Procedure:                Colonoscopy Indications:              Surveillance: History of numerous (> 10) adenomas                            on last colonoscopy (< 3 yrs), Last colonoscopy:                            August 2017 Medicines:                Monitored Anesthesia Care Procedure:                Pre-Anesthesia Assessment:                           - Prior to the procedure, a History and Physical                            was performed, and patient medications and                            allergies were reviewed. The patient's tolerance of                            previous anesthesia was also reviewed. The risks                            and benefits of the procedure and the sedation                            options and risks were discussed with the patient.                            All questions were answered, and informed consent                            was obtained. Prior Anticoagulants: The patient has                            taken no previous anticoagulant or antiplatelet                            agents. ASA Grade Assessment: II - A patient with                            mild systemic disease. After reviewing the risks                            and benefits, the patient was deemed in  satisfactory condition to undergo the procedure.                           After obtaining informed consent, the colonoscope                            was passed under direct vision. Throughout the                            procedure, the patient's blood pressure, pulse, and                            oxygen saturations were monitored continuously. The                            Colonoscope was introduced through the anus and                       advanced to the the cecum, identified by                            appendiceal orifice and ileocecal valve. The                            colonoscopy was performed without difficulty. The                            patient tolerated the procedure well. The quality                            of the bowel preparation was excellent. The                            ileocecal valve, appendiceal orifice, and rectum                            were photographed. Scope In: 9:34:46 AM Scope Out: 9:48:43 AM Scope Withdrawal Time: 0 hours 11 minutes 13 seconds  Total Procedure Duration: 0 hours 13 minutes 57 seconds  Findings:                 The digital rectal exam findings include                            hemorrhoids.                           Two sessile polyps were found in the cecum. The                            polyps were 3 to 5 mm in size. These polyps were                            removed with a cold snare. Resection and retrieval  were complete.                           A 5 mm polyp was found in the ascending colon. The                            polyp was sessile. The polyp was removed with a                            cold snare. Resection and retrieval were complete.                           Two sessile polyps were found in the descending                            colon. The polyps were 5 to 6 mm in size. These                            polyps were removed with a cold snare. Resection                            and retrieval were complete.                           Internal hemorrhoids were found during retroflexion                            and during digital exam. The hemorrhoids were                            medium-sized. Complications:            No immediate complications. Estimated Blood Loss:     Estimated blood loss was minimal. Impression:               - Hemorrhoids found on digital rectal exam.                            - Two 3 to 5 mm polyps in the cecum, removed with a                            cold snare. Resected and retrieved.                           - One 5 mm polyp in the ascending colon, removed                            with a cold snare. Resected and retrieved.                           - Two 5 to 6 mm polyps in the descending colon,                            removed with a cold  snare. Resected and retrieved.                           - Internal hemorrhoids. Recommendation:           - Patient has a contact number available for                            emergencies. The signs and symptoms of potential                            delayed complications were discussed with the                            patient. Return to normal activities tomorrow.                            Written discharge instructions were provided to the                            patient.                           - Resume previous diet.                           - Continue present medications.                           - Await pathology results.                           - Repeat colonoscopy in 3 years for surveillance. Jerene Bears, MD 05/15/2017 9:53:08 AM This report has been signed electronically.

## 2017-05-15 NOTE — Patient Instructions (Signed)
HANDOUTS GIVEN FOR POLYPS AND HEMORRHOIDS  YOU HAD AN ENDOSCOPIC PROCEDURE TODAY AT THE [facility]:   Refer to the procedure report that was given to you for any specific questions about what was found during the examination.  If the procedure report does not answer your questions, please call your gastroenterologist to clarify.  If you requested that your care partner not be given the details of your procedure findings, then the procedure report has been included in a sealed envelope for you to review at your convenience later.  YOU SHOULD EXPECT: Some feelings of bloating in the abdomen. Passage of more gas than usual.  Walking can help get rid of the air that was put into your GI tract during the procedure and reduce the bloating. If you had a lower endoscopy (such as a colonoscopy or flexible sigmoidoscopy) you may notice spotting of blood in your stool or on the toilet paper. If you underwent a bowel prep for your procedure, you may not have a normal bowel movement for a few days.  Please Note:  You might notice some irritation and congestion in your nose or some drainage.  This is from the oxygen used during your procedure.  There is no need for concern and it should clear up in a day or so.  SYMPTOMS TO REPORT IMMEDIATELY:   Following lower endoscopy (colonoscopy or flexible sigmoidoscopy):  Excessive amounts of blood in the stool  Significant tenderness or worsening of abdominal pains  Swelling of the abdomen that is new, acute  Fever of 100F or higher   For urgent or emergent issues, a gastroenterologist can be reached at any hour by calling [phone].   DIET:  We do recommend a small meal at first, but then you may proceed to your regular diet.  Drink plenty of fluids but you should avoid alcoholic beverages for 24 hours.  ACTIVITY:  You should plan to take it easy for the rest of today and you should NOT DRIVE or use heavy machinery until tomorrow (because of the  sedation medicines used during the test).    FOLLOW UP: Our staff will call the number listed on your records the next business day following your procedure to check on you and address any questions or concerns that you may have regarding the information given to you following your procedure. If we do not reach you, we will leave a message.  However, if you are feeling well and you are not experiencing any problems, there is no need to return our call.  We will assume that you have returned to your regular daily activities without incident.  If any biopsies were taken you will be contacted by phone or by letter within the next 1-3 weeks.  Please call us at [phone] if you have not heard about the biopsies in 3 weeks.    SIGNATURES/CONFIDENTIALITY: You and/or your care partner have signed paperwork which will be entered into your electronic medical record.  These signatures attest to the fact that that the information above on your After Visit Summary has been reviewed and is understood.  Full responsibility of the confidentiality of this discharge information lies with you and/or your care-partner. 

## 2017-05-15 NOTE — Progress Notes (Signed)
Called to room to assist during endoscopic procedure.  Patient ID and intended procedure confirmed with present staff. Received instructions for my participation in the procedure from the performing physician.  

## 2017-05-18 ENCOUNTER — Telehealth: Payer: Self-pay | Admitting: *Deleted

## 2017-05-18 NOTE — Telephone Encounter (Signed)
  Follow up Call-  Call back number 05/15/2017 11/28/2015  Post procedure Call Back phone  # [phone]  Permission to leave phone message Yes Yes  Some recent data might be hidden     Patient questions:  Do you have a fever, pain , or abdominal swelling? No. Pain Score  0 *  Have you tolerated food without any problems? Yes.    Have you been able to return to your normal activities? Yes.    Do you have any questions about your discharge instructions: Diet   No. Medications  No. Follow up visit  No.  Do you have questions or concerns about your Care? No.  Actions: * If pain score is 4 or above: No action needed, pain <4.

## 2017-05-19 ENCOUNTER — Encounter: Payer: Self-pay | Admitting: Internal Medicine

## 2017-05-20 ENCOUNTER — Other Ambulatory Visit: Payer: Self-pay | Admitting: Internal Medicine

## 2017-05-20 DIAGNOSIS — I6523 Occlusion and stenosis of bilateral carotid arteries: Secondary | ICD-10-CM

## 2017-05-20 DIAGNOSIS — E785 Hyperlipidemia, unspecified: Secondary | ICD-10-CM

## 2017-06-25 ENCOUNTER — Other Ambulatory Visit: Payer: Self-pay | Admitting: Internal Medicine

## 2017-06-25 DIAGNOSIS — E559 Vitamin D deficiency, unspecified: Secondary | ICD-10-CM

## 2017-06-25 DIAGNOSIS — S52502D Unspecified fracture of the lower end of left radius, subsequent encounter for closed fracture with routine healing: Secondary | ICD-10-CM

## 2017-06-25 NOTE — Telephone Encounter (Signed)
Refilled: 02/20/2017 Last OV: 02/23/2017 Next OV: not scheduled

## 2018-01-19 ENCOUNTER — Other Ambulatory Visit: Payer: Self-pay | Admitting: Internal Medicine

## 2018-01-19 DIAGNOSIS — I6523 Occlusion and stenosis of bilateral carotid arteries: Secondary | ICD-10-CM

## 2018-01-19 DIAGNOSIS — E785 Hyperlipidemia, unspecified: Secondary | ICD-10-CM

## 2018-04-05 ENCOUNTER — Other Ambulatory Visit: Payer: Self-pay | Admitting: Internal Medicine

## 2018-04-05 DIAGNOSIS — I6523 Occlusion and stenosis of bilateral carotid arteries: Secondary | ICD-10-CM

## 2018-04-05 DIAGNOSIS — E785 Hyperlipidemia, unspecified: Secondary | ICD-10-CM

## 2018-04-21 IMAGING — DX DG HAND COMPLETE 3+V*L*
3 series · 3 of 3 positions shown · non-contrast
Comparison: None.

CLINICAL DATA: Edema and pain

EXAM:
LEFT HAND - COMPLETE 3+ VIEW

[hand pa]
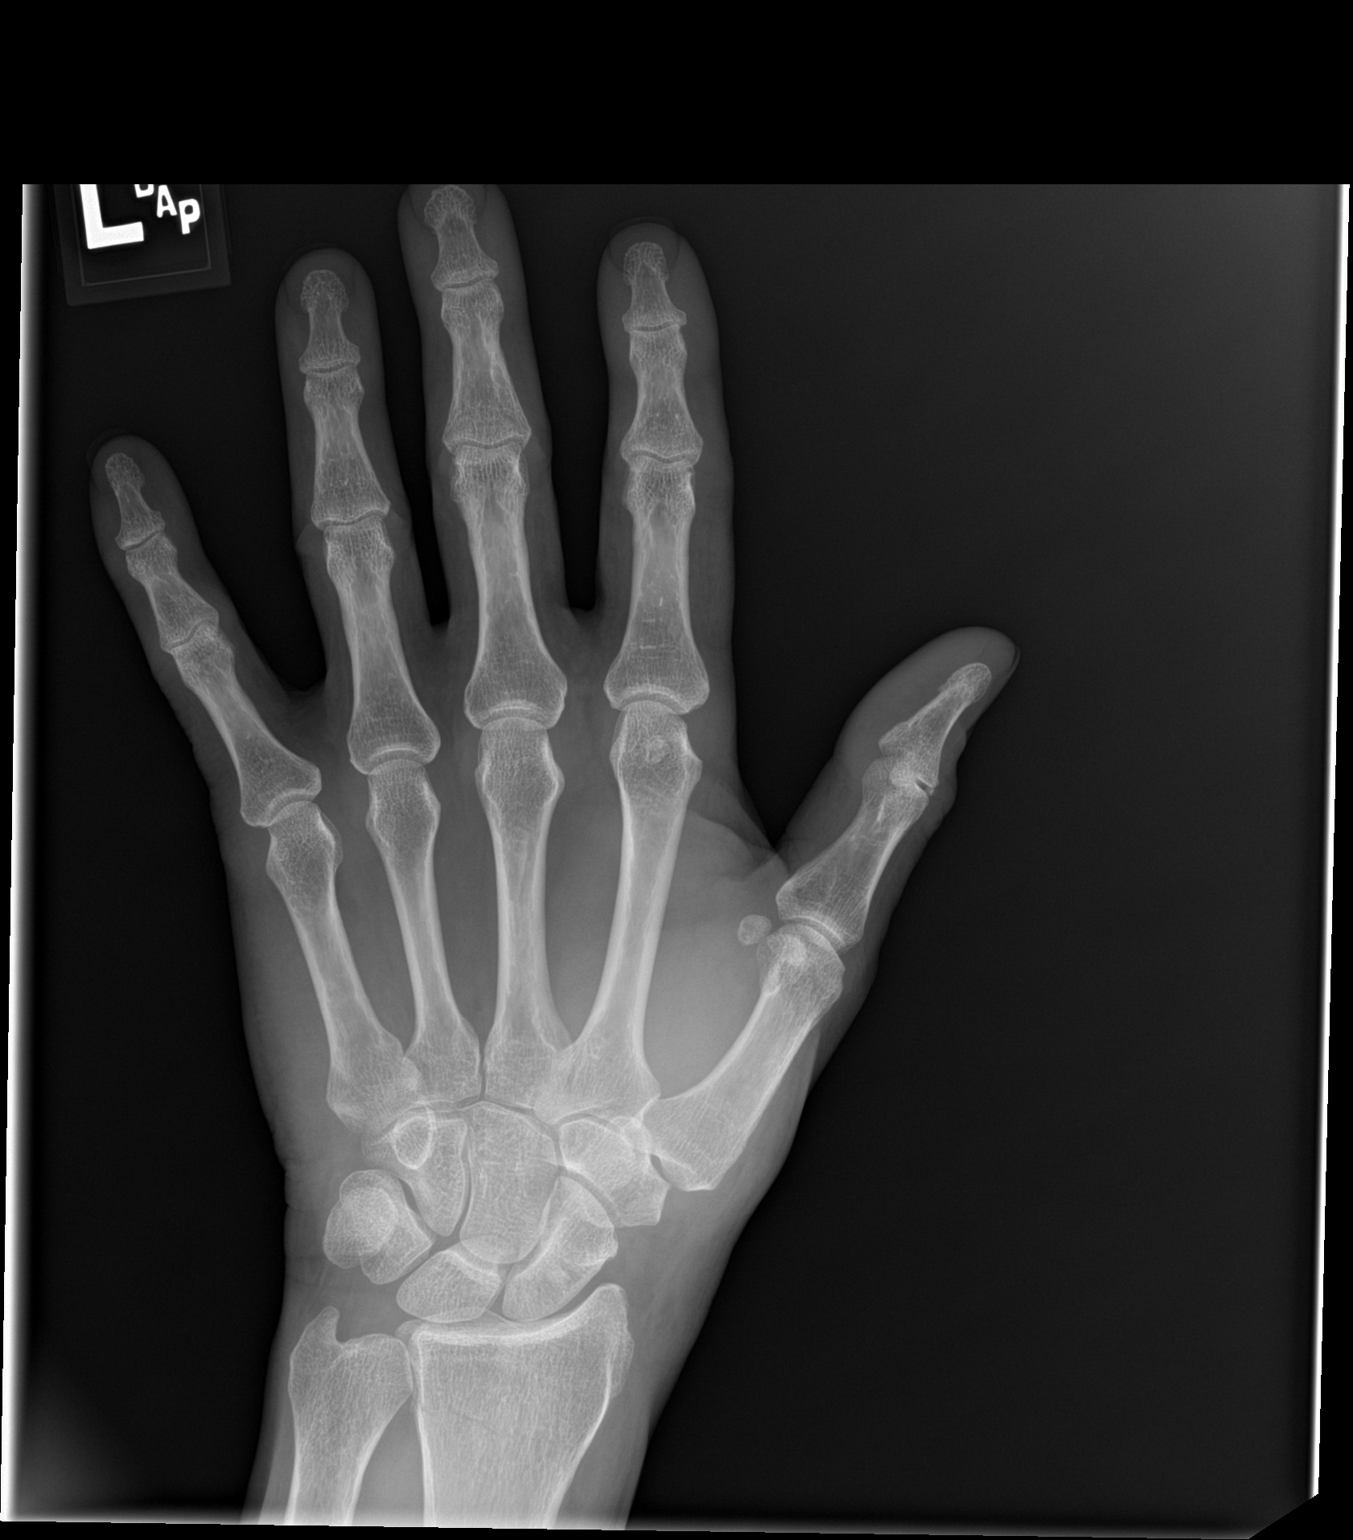

[hand obl]
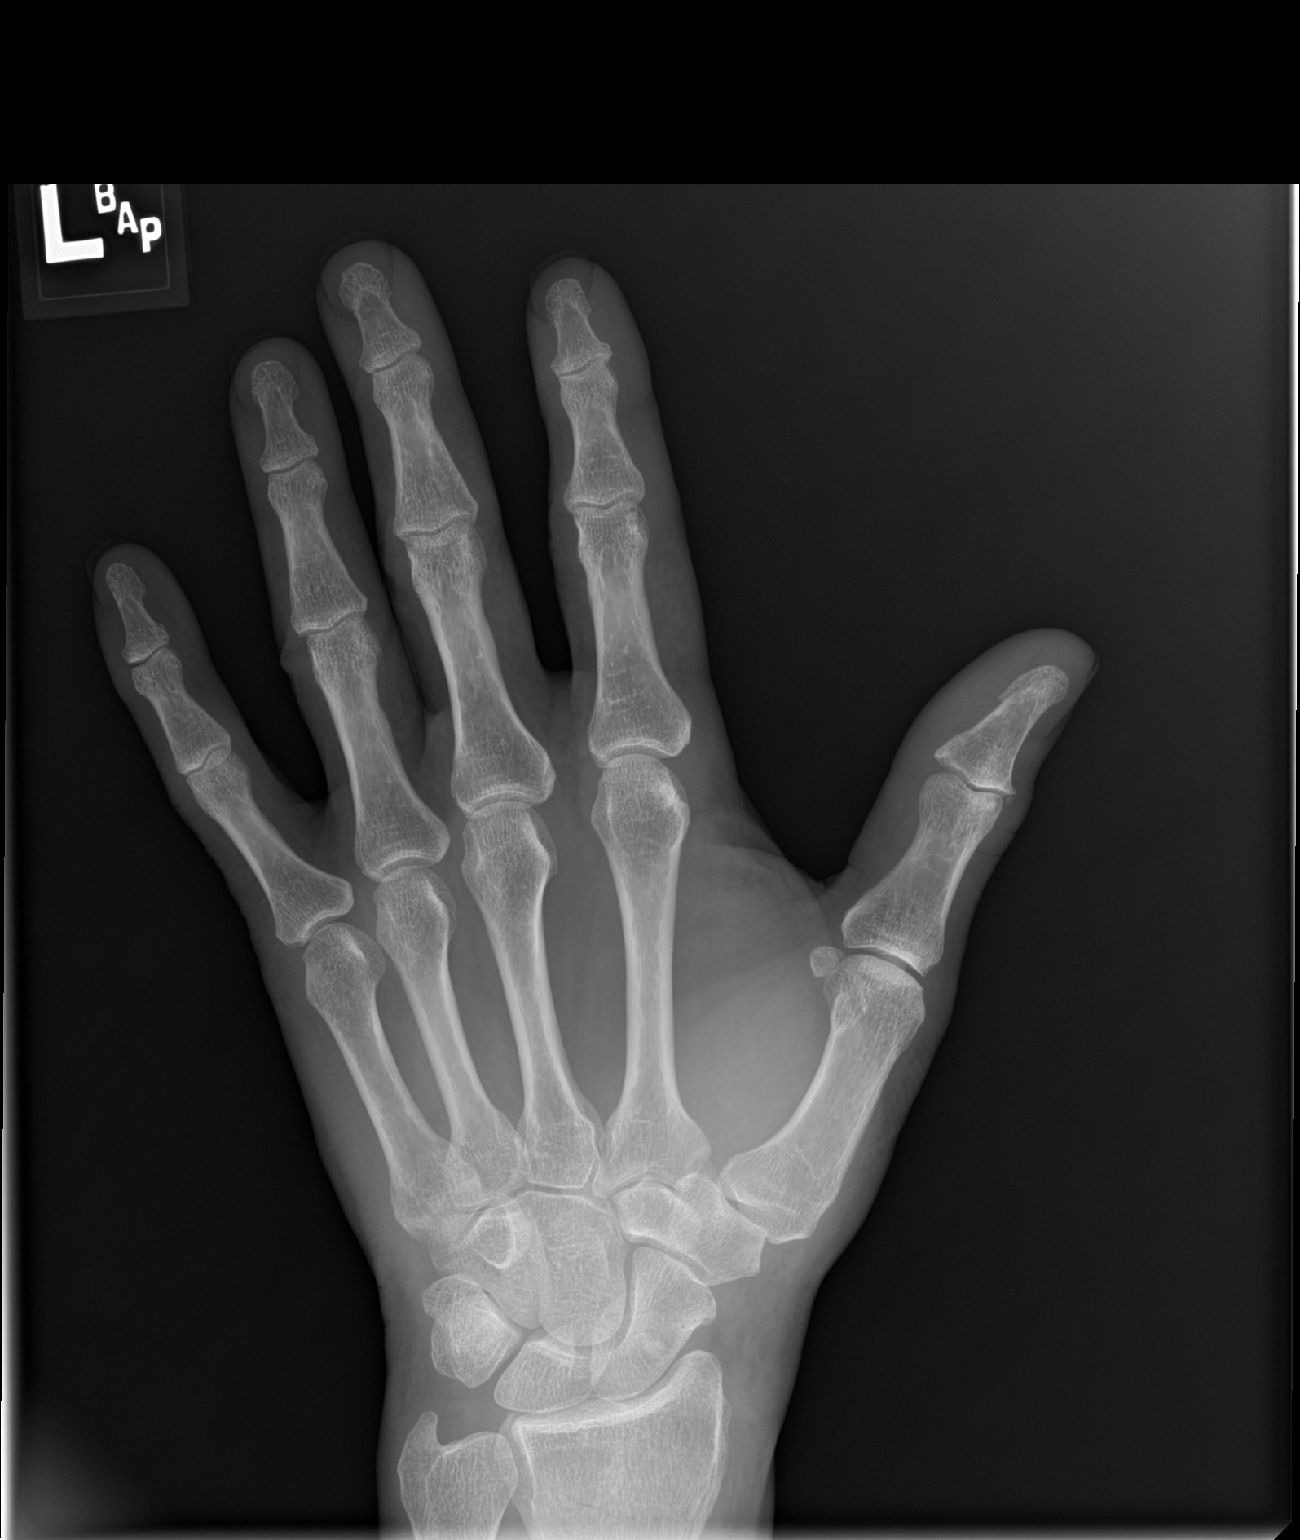

[hand lat]
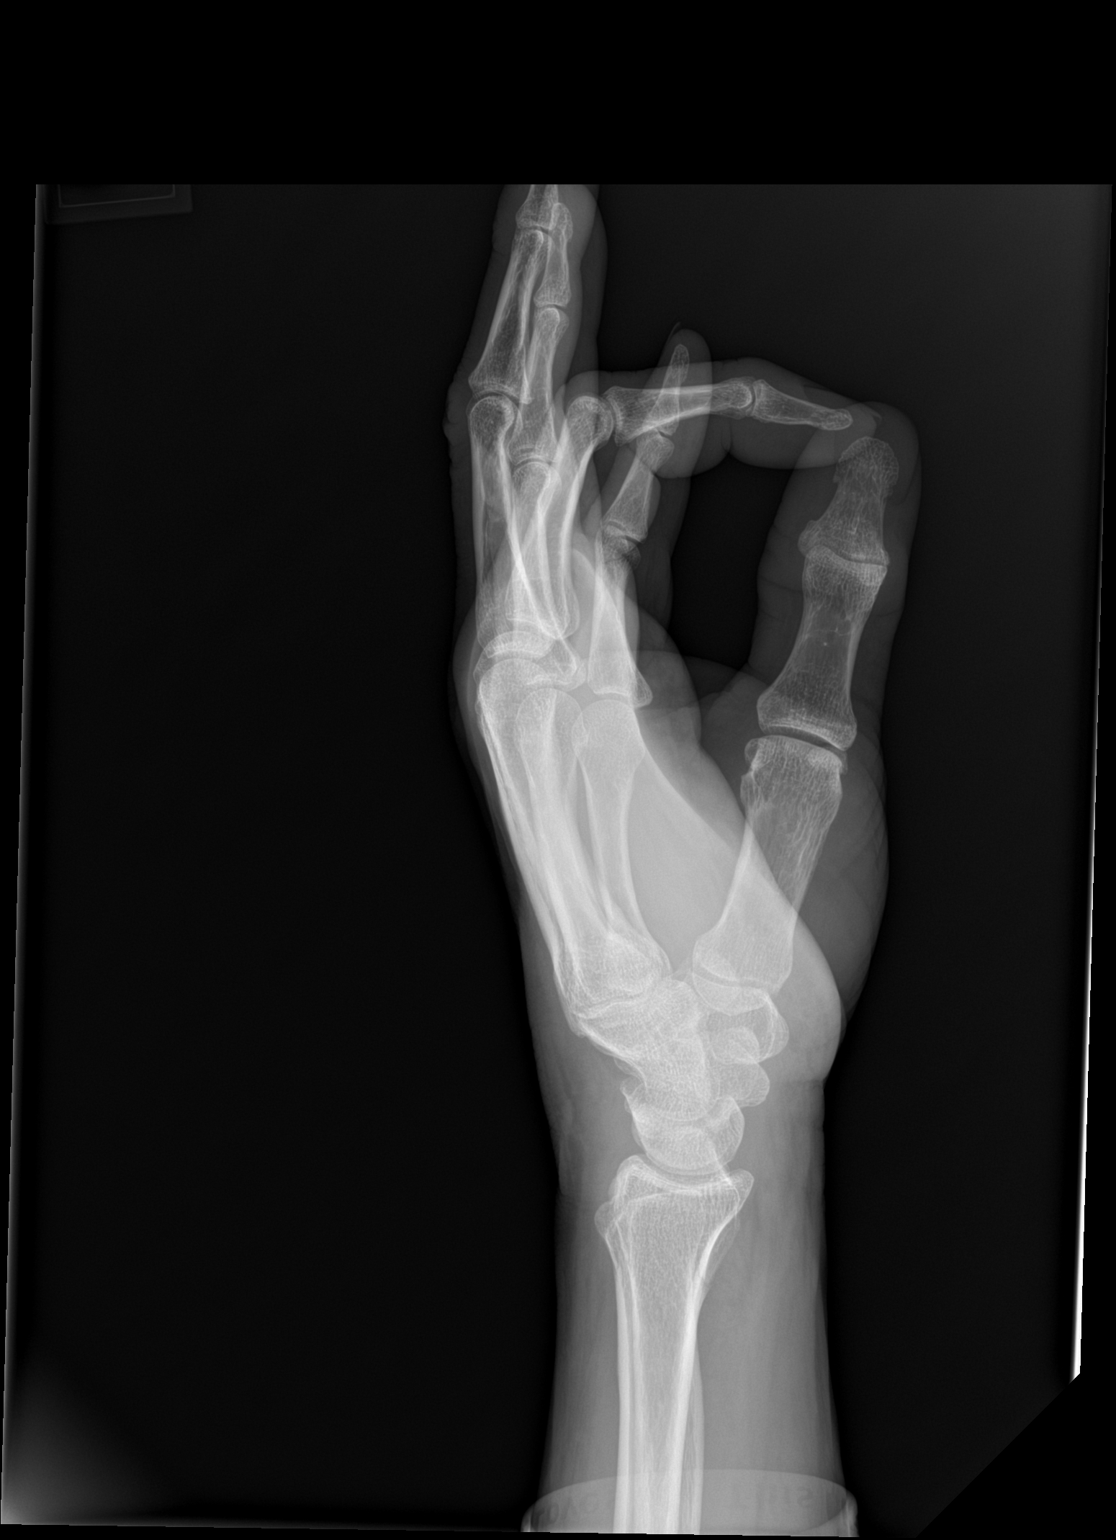

[3 of 3 positions shown; findings below may reference images not displayed]

FINDINGS: No acute fracture or malalignment within the digits of the hand.
Acute nondisplaced fracture involving the distal metaphysis of the
radius.
IMPRESSION: Acute nondisplaced distal radius fracture
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
## 2020-10-22 ENCOUNTER — Encounter: Payer: Self-pay | Admitting: Internal Medicine
